# Patient Record
Sex: Female | Born: 1945 | Race: White | Hispanic: No | Marital: Married | Smoking: Former smoker
Health system: Southern US, Community
[De-identification: ages and names within clinical notes are randomized; demographics above are authoritative.]

## PROBLEM LIST (undated history)

## (undated) DIAGNOSIS — N200 Calculus of kidney: Secondary | ICD-10-CM

## (undated) DIAGNOSIS — K449 Diaphragmatic hernia without obstruction or gangrene: Secondary | ICD-10-CM

## (undated) DIAGNOSIS — I10 Essential (primary) hypertension: Secondary | ICD-10-CM

## (undated) DIAGNOSIS — K2270 Barrett's esophagus without dysplasia: Secondary | ICD-10-CM

## (undated) DIAGNOSIS — F32A Depression, unspecified: Secondary | ICD-10-CM

## (undated) DIAGNOSIS — F329 Major depressive disorder, single episode, unspecified: Secondary | ICD-10-CM

## (undated) DIAGNOSIS — I4891 Unspecified atrial fibrillation: Secondary | ICD-10-CM

## (undated) HISTORY — PX: CHOLECYSTECTOMY: SHX55

---

## 2009-09-17 ENCOUNTER — Ambulatory Visit: Payer: Self-pay | Admitting: Diagnostic Radiology

## 2009-09-17 ENCOUNTER — Emergency Department (HOSPITAL_BASED_OUTPATIENT_CLINIC_OR_DEPARTMENT_OTHER): Admission: EM | Admit: 2009-09-17 | Discharge: 2009-09-17 | Payer: Self-pay | Admitting: Emergency Medicine

## 2009-09-19 ENCOUNTER — Emergency Department (HOSPITAL_BASED_OUTPATIENT_CLINIC_OR_DEPARTMENT_OTHER): Admission: EM | Admit: 2009-09-19 | Discharge: 2009-09-19 | Payer: Self-pay | Admitting: Emergency Medicine

## 2009-09-19 ENCOUNTER — Ambulatory Visit: Payer: Self-pay | Admitting: Diagnostic Radiology

## 2010-08-17 LAB — BASIC METABOLIC PANEL
CO2: 20 mEq/L (ref 19–32)
Calcium: 8.8 mg/dL (ref 8.4–10.5)
Chloride: 104 mEq/L (ref 96–112)
GFR calc non Af Amer: 56 mL/min — ABNORMAL LOW (ref >60)
Potassium: 3 mEq/L — ABNORMAL LOW (ref 3.5–5.1)

## 2010-08-17 LAB — URINALYSIS, ROUTINE W REFLEX MICROSCOPIC
Glucose, UA: NEGATIVE mg/dL
Ketones, ur: NEGATIVE mg/dL
Nitrite: NEGATIVE
Nitrite: NEGATIVE
Protein, ur: 30 mg/dL — AB
Protein, ur: NEGATIVE mg/dL
Specific Gravity, Urine: 1.008 (ref 1.005–1.030)
Urobilinogen, UA: 0.2 mg/dL (ref 0.0–1.0)

## 2010-08-17 LAB — DIFFERENTIAL
Basophils Relative: 1 % (ref 0–1)
Eosinophils Absolute: 0 10*3/uL (ref 0.0–0.7)
Eosinophils Absolute: 0.1 10*3/uL (ref 0.0–0.7)
Eosinophils Relative: 1 % (ref 0–5)
Lymphs Abs: 0.8 10*3/uL (ref 0.7–4.0)
Monocytes Absolute: 0.9 10*3/uL (ref 0.1–1.0)
Monocytes Absolute: 0.9 10*3/uL (ref 0.1–1.0)
Monocytes Relative: 11 % (ref 3–12)
Monocytes Relative: 11 % (ref 3–12)
Neutro Abs: 6.6 10*3/uL (ref 1.7–7.7)

## 2010-08-17 LAB — COMPREHENSIVE METABOLIC PANEL
Albumin: 3.5 g/dL (ref 3.5–5.2)
CO2: 22 mEq/L (ref 19–32)
Calcium: 8.8 mg/dL (ref 8.4–10.5)
Creatinine, Ser: 0.9 mg/dL (ref 0.4–1.2)
GFR calc non Af Amer: >60 mL/min (ref >60)
Glucose, Bld: 116 mg/dL — ABNORMAL HIGH (ref 70–99)
Total Bilirubin: 0.6 mg/dL (ref 0.3–1.2)

## 2010-08-17 LAB — CBC
HCT: 34.4 % — ABNORMAL LOW (ref 36.0–46.0)
Hemoglobin: 11.8 g/dL — ABNORMAL LOW (ref 12.0–15.0)
MCV: 84.1 fL (ref 78.0–100.0)
Platelets: 364 10*3/uL (ref 150–400)
RDW: 12.5 % (ref 11.5–15.5)

## 2010-08-17 LAB — URINE MICROSCOPIC-ADD ON

## 2010-08-17 LAB — LIPASE, BLOOD: Lipase: 28 U/L (ref 23–300)

## 2010-08-17 LAB — RAPID STREP SCREEN (MED CTR MEBANE ONLY): Streptococcus, Group A Screen (Direct): NEGATIVE

## 2011-11-02 ENCOUNTER — Emergency Department (HOSPITAL_BASED_OUTPATIENT_CLINIC_OR_DEPARTMENT_OTHER)
Admission: EM | Admit: 2011-11-02 | Discharge: 2011-11-02 | Disposition: A | Discharge status: Home / Self Care | Payer: Self-pay | Attending: Emergency Medicine | Admitting: Emergency Medicine

## 2011-11-02 ENCOUNTER — Emergency Department (HOSPITAL_BASED_OUTPATIENT_CLINIC_OR_DEPARTMENT_OTHER): Payer: Self-pay

## 2011-11-02 ENCOUNTER — Encounter (HOSPITAL_BASED_OUTPATIENT_CLINIC_OR_DEPARTMENT_OTHER): Payer: Self-pay | Admitting: Emergency Medicine

## 2011-11-02 DIAGNOSIS — Z0389 Encounter for observation for other suspected diseases and conditions ruled out: Secondary | ICD-10-CM | POA: Insufficient documentation

## 2011-11-02 DIAGNOSIS — I10 Essential (primary) hypertension: Secondary | ICD-10-CM | POA: Insufficient documentation

## 2011-11-02 DIAGNOSIS — Z0000 Encounter for general adult medical examination without abnormal findings: Secondary | ICD-10-CM

## 2011-11-02 HISTORY — DX: Barrett's esophagus without dysplasia: K22.70

## 2011-11-02 HISTORY — DX: Major depressive disorder, single episode, unspecified: F32.9

## 2011-11-02 HISTORY — DX: Essential (primary) hypertension: I10

## 2011-11-02 HISTORY — DX: Depression, unspecified: F32.A

## 2011-11-02 NOTE — ED Notes (Signed)
Pt swallowed a straight pin about 30 min ago.  Pt denies any resp difficulty.  Does not have any sensation of it at this time.

## 2011-11-02 NOTE — Discharge Instructions (Signed)
Continue to monitor yourself. If you continue to feel fine without any pain then it's important to followup with your primary care provider and your GI specialist as needed for your routine physical exams and colonoscopy. However if you have acute onset pain at any time then return to emergency department for reevaluation.  Normal Exam, Adult You were seen and examined today in our facility. Our caregiver found nothing wrong on the exam. If testing was done such as lab work or x-rays, they did not indicate enough wrong to suggest that treatment should be given. The caregiver then must decide after testing is finished if your concern is a physical problem or illness that needs treatment. Today no treatable problem was found. Even if reassurance was given, if you feel you are getting worse when you get home make sure you call back or return to our department. For the protection of your privacy, test results can not be given over the phone. Make sure you receive the results of your test. Ask as to how these results are to be obtained if you have not been informed. It is your responsibility to obtain your test results. Your condition can change over time. Sometimes it takes more than one visit to determine the cause of your symptoms. It is important that you monitor your condition for any changes. SEEK IMMEDIATE MEDICAL CARE IF:  You develop an oral temperature above 102 F (38.9 C), which lasts for more than 2 days, not controlled by medications. Only take over-the-counter or prescription medicines for pain, discomfort, or fever as directed by your caregiver.   You develop a loss of appetite or start throwing up (vomiting).   You develop a rash, cough, belly (abdominal) pain, earache, headache, or develop pain in neck, muscles, or joints.   The problem or problems which brought you to our facility become worse or are a cause of more concern.  If we have told you today your exam and tests are normal, and  a short while later you feel this is not right, please seek medical attention so you may be rechecked. Document Released: 08/28/2000 Document Revised: 05/05/2011 Document Reviewed: 12/21/2007 [facility] Patient Information 2012 [place], [place].

## 2011-11-02 NOTE — ED Notes (Signed)
Patient transported to X-ray 

## 2011-11-02 NOTE — ED Provider Notes (Signed)
History     CSN: [id number]  Arrival date & time 11/02/11  Ernestina Columbia   First MD Initiated Contact with Patient 11/02/11 1952      Chief Complaint  Patient presents with  . Foreign Body    (Consider location/radiation/quality/duration/timing/severity/associated sxs/prior treatment) HPI  Patient presents to emergency department concerned that there is a chance that she could have swallowed a straight pin about 30 minutes prior to arrival. Patient states she was sewing and had placed 2 pins in her mouth to hold in all of the sudden became aware that she only had one pin left in her mouth. Patient states she panicked and pulled the pin from her mouth and is unsure whether or not she swallowed a second opinion. However patient states she did not actually feel herself swallow the pin, did not have any discomfort, and currently has no discomfort. Patient states she looked around her and could not find a second pin on the ground. Once again she denies any pain in her throat, neck, chest, or abdomen. She has been pain-free the whole time. Patient states she does have a primary care physician and last had colonoscopy and endoscopy about 10 years ago because she has barrett esophagus and is due for a colonoscopy in the near future.  Past Medical History  Diagnosis Date  . Hypertension   . Barrett esophagus   . Depression     History reviewed. No pertinent past surgical history.  No family history on file.  History  Substance Use Topics  . Smoking status: Former Games developer  . Smokeless tobacco: Never Used  . Alcohol Use: Yes     occ    OB History    Grav Para Term Preterm Abortions TAB SAB Ect Mult Living                  Review of Systems  All other systems reviewed and are negative.    Allergies  Review of patient's allergies indicates no known allergies.  Home Medications   Current Outpatient Rx  Name Route Sig Dispense Refill  . AMLODIPINE BESYLATE 10 MG PO TABS Oral Take 10 mg  by mouth daily.    Marland Kitchen ESCITALOPRAM OXALATE 10 MG PO TABS Oral Take 10 mg by mouth daily.    . IBUPROFEN 200 MG PO TABS Oral Take 400 mg by mouth every 6 (six) hours as needed. Patient used this medication for headache.    Marland Kitchen PANTOPRAZOLE SODIUM 40 MG PO TBEC Oral Take 40 mg by mouth daily. Patient is using this medication for Barrett's disease.      BP 152/92  Pulse 94  Temp(Src) 98.3 F (36.8 C) (Oral)  Resp 20  Ht 5\' 3"  (1.6 m)  Wt 160 lb (72.576 kg)  BMI 28.34 kg/m2  SpO2 99%  Physical Exam  Nursing note and vitals reviewed. Constitutional: She is oriented to person, place, and time. She appears well-developed and well-nourished. No distress.  HENT:  Head: Normocephalic and atraumatic.  Mouth/Throat: Oropharynx is clear and moist.  Eyes: Conjunctivae are normal.  Neck: Normal range of motion. Neck supple. No tracheal deviation present.  Cardiovascular: Normal rate, regular rhythm, normal heart sounds and intact distal pulses.  Exam reveals no gallop and no friction rub.   No murmur heard. Pulmonary/Chest: Effort normal and breath sounds normal. No stridor. No respiratory distress. She has no wheezes. She has no rales. She exhibits no tenderness.  Abdominal: Soft. Bowel sounds are normal. She exhibits no distension and  no mass. There is no tenderness. There is no rebound and no guarding.  Musculoskeletal: She exhibits no tenderness.  Neurological: She is alert and oriented to person, place, and time.  Skin: No rash noted. She is not diaphoretic.  Psychiatric: She has a normal mood and affect.    ED Course  Procedures (including critical care time)  Labs Reviewed - No data to display Dg Abd Acute W/chest  11/02/2011  *RADIOLOGY REPORT*  Clinical Data: Possible aspiration of a foreign body.  ACUTE ABDOMEN SERIES (ABDOMEN 2 VIEW & CHEST 1 VIEW)  Comparison: Chest x-ray 09/17/2009.  Findings: Lung volumes are normal.  No consolidative airspace disease.  No pleural effusions.  No  pneumothorax.  No pulmonary nodule or mass noted.  Pulmonary vasculature and the cardiomediastinal silhouette are within normal limits.  No radiopaque foreign body identified.  No pneumoperitoneum.  Supine and upright views of the abdomen demonstrate no unexpected radiopaque foreign body.  Gas and stool are seen throughout the colon.  There is a paucity of distal colonic gas noted.  However, there is no pathologic distension of small bowel.  IMPRESSION: 1.  No radiopaque foreign body identified. 2.  Nonspecific, nonobstructive bowel gas pattern. 3.  No pneumoperitoneum. 4.  No radiographic evidence of acute cardiopulmonary disease.  Original Report Authenticated By: Florencia Reasons, M.D.     1. Normal physical exam       MDM  There is no certainty that patient actually swallowed a straight pin with no radiographic evidence of foreign body in given that the straight pin is metal there should be a highly likelihood that foreign body would show up on radiograph present. Patient has no pain throughout the events. At this time there is no need for further evaluation unless patient becomes symptomatic. I spoke at length with patient about following up with her primary care physician and her gastroenterologist in the near future for her colonoscopy that she is due for however returning to emergency department at any time for any changing or worsening of symptoms. Patient voices her understanding and is agreeable plan.        [place], [place] 11/02/11 2148

## 2011-11-02 NOTE — ED Provider Notes (Signed)
Medical screening examination/treatment/procedure(s) were performed by non-physician practitioner and as supervising physician I was immediately available for consultation/collaboration.   Mayur Duman A Dontavious Emily, MD 11/02/11 2238 

## 2011-11-02 NOTE — ED Notes (Signed)
Pt states that she swallowed a straight pin approx 45 mins ago. Pt denies any bleeding, no diff breathing, no discomfort at this time.

## 2013-05-22 ENCOUNTER — Encounter (HOSPITAL_BASED_OUTPATIENT_CLINIC_OR_DEPARTMENT_OTHER): Payer: Self-pay | Admitting: Emergency Medicine

## 2013-05-22 ENCOUNTER — Emergency Department (HOSPITAL_BASED_OUTPATIENT_CLINIC_OR_DEPARTMENT_OTHER)
Admission: EM | Admit: 2013-05-22 | Discharge: 2013-05-23 | Disposition: A | Discharge status: Other Acute Inpatient Hospital | Payer: Medicare Other | Attending: Emergency Medicine | Admitting: Emergency Medicine

## 2013-05-22 DIAGNOSIS — R110 Nausea: Secondary | ICD-10-CM | POA: Insufficient documentation

## 2013-05-22 DIAGNOSIS — E876 Hypokalemia: Secondary | ICD-10-CM | POA: Insufficient documentation

## 2013-05-22 DIAGNOSIS — Z87891 Personal history of nicotine dependence: Secondary | ICD-10-CM | POA: Insufficient documentation

## 2013-05-22 DIAGNOSIS — F329 Major depressive disorder, single episode, unspecified: Secondary | ICD-10-CM | POA: Insufficient documentation

## 2013-05-22 DIAGNOSIS — R209 Unspecified disturbances of skin sensation: Secondary | ICD-10-CM | POA: Insufficient documentation

## 2013-05-22 DIAGNOSIS — R079 Chest pain, unspecified: Secondary | ICD-10-CM

## 2013-05-22 DIAGNOSIS — R072 Precordial pain: Secondary | ICD-10-CM | POA: Insufficient documentation

## 2013-05-22 DIAGNOSIS — Z8719 Personal history of other diseases of the digestive system: Secondary | ICD-10-CM | POA: Insufficient documentation

## 2013-05-22 DIAGNOSIS — F3289 Other specified depressive episodes: Secondary | ICD-10-CM | POA: Insufficient documentation

## 2013-05-22 DIAGNOSIS — Z79899 Other long term (current) drug therapy: Secondary | ICD-10-CM | POA: Insufficient documentation

## 2013-05-22 DIAGNOSIS — I10 Essential (primary) hypertension: Secondary | ICD-10-CM | POA: Insufficient documentation

## 2013-05-22 DIAGNOSIS — R000 Tachycardia, unspecified: Secondary | ICD-10-CM | POA: Insufficient documentation

## 2013-05-22 MED ORDER — ASPIRIN 81 MG PO CHEW
CHEWABLE_TABLET | ORAL | Status: AC
  Filled 2013-05-22: qty 2

## 2013-05-22 NOTE — ED Notes (Signed)
MD at bedside. 

## 2013-05-22 NOTE — ED Notes (Signed)
Patient oxygen saturation is 100% on R/a not 10%

## 2013-05-22 NOTE — ED Provider Notes (Signed)
CSN: [id number]     Arrival date & time 05/22/13  2337 History   First MD Initiated Contact with Patient 05/22/13 2352     This chart was scribed for Misty Seamen, MD by Ladona Ridgel Day, ED scribe. This patient was seen in room MH09/MH09 and the patient's care was started at 2352.  Chief Complaint  Patient presents with  . Chest Pain   The history is provided by the patient. No language interpreter was used.   HPI Comments: Misty Santos is a 67 y.o. female who presents to the Emergency Department complaining of non-radiating, 4/10 midsternal CP, onset x1 hour ago PTA. She reports paraesthesias to her hands and feet. She took x2 baby ASA PTA. She reports mild nausea but denies diaphoresis. She denies any previous similar episodes. She denies hx of esophageal spasms. She has baseline ankle swelling. She denies flu like sx, denies emesis episodes or diarrhea.   Past Medical History  Diagnosis Date  . Hypertension   . Barrett esophagus   . Depression    History reviewed. No pertinent past surgical history. No family history on file. History  Substance Use Topics  . Smoking status: Former Games developer  . Smokeless tobacco: Never Used  . Alcohol Use: Yes     Comment: occ   OB History   Grav Para Term Preterm Abortions TAB SAB Ect Mult Living                 Review of Systems A complete 10 system review of systems was obtained and all systems are negative except as noted in the HPI and PMH.   Allergies  Review of patient's allergies indicates no known allergies.  Home Medications   Current Outpatient Rx  Name  Route  Sig  Dispense  Refill  . atorvastatin (LIPITOR) 20 MG tablet   Oral   Take 20 mg by mouth daily.         . potassium chloride SA (K-DUR,KLOR-CON) 20 MEQ tablet   Oral   Take 20 mEq by mouth 2 (two) times daily.         Marland Kitchen amLODipine (NORVASC) 10 MG tablet   Oral   Take 10 mg by mouth daily.         Marland Kitchen escitalopram (LEXAPRO) 10 MG tablet   Oral   Take 10 mg  by mouth daily.         Marland Kitchen ibuprofen (ADVIL,MOTRIN) 200 MG tablet   Oral   Take 400 mg by mouth every 6 (six) hours as needed. Patient used this medication for headache.         . pantoprazole (PROTONIX) 40 MG tablet   Oral   Take 40 mg by mouth daily. Patient is using this medication for Barrett's disease.          Triage Vitals: BP 169/96  Pulse 118  Temp(Src) 97.6 F (36.4 C) (Oral)  Resp 20  Ht 5\' 3"  (1.6 m)  Wt 165 lb (74.844 kg)  BMI 29.24 kg/m2  SpO2 100% Physical Exam  Nursing note and vitals reviewed. Constitutional: She is oriented to person, place, and time. She appears well-developed and well-nourished.  HENT:  Head: Normocephalic and atraumatic.  Eyes: Conjunctivae are normal. Right eye exhibits no discharge. Left eye exhibits no discharge.  Neck: Normal range of motion.  Cardiovascular: Regular rhythm and normal heart sounds.   No murmur heard. tachycardic  Pulmonary/Chest: Effort normal. No respiratory distress.  Musculoskeletal: Normal range of motion. She exhibits  no edema.  Neurological: She is alert and oriented to person, place, and time.  Skin: Skin is warm and dry.  Psychiatric: She has a normal mood and affect. Thought content normal.    ED Course  Procedures (including critical care time) DIAGNOSTIC STUDIES: Oxygen Saturation is 100% on room air, normal by my interpretation.    COORDINATION OF CARE: At 1200 AM Discussed treatment plan with patient which includes ASA, cardiac enzymes, CXR, blood work, NTG, D-dimer, EKG. Patient agrees.   EKG Interpretation    Date/Time:  Wednesday May 22 2013 23:49:07 EST Ventricular Rate:  109 PR Interval:  170 QRS Duration: 88 QT Interval:  348 QTC Calculation: 468 R Axis:   -38 Text Interpretation:  Sinus tachycardia Left axis deviation Nonspecific ST and T wave abnormality Abnormal ECG No old tracing to compare Confirmed by Anarie Kalish  MD, Merl Guardino ([id number]) on 05/22/2013 11:52:47 PM       MDM    Nursing notes and vitals signs, including pulse oximetry, reviewed.  Summary of this visit's results, reviewed by myself:  Labs:  Results for orders placed during the hospital encounter of 05/22/13 (from the past 24 hour(s))  CBC WITH DIFFERENTIAL     Status: None   Collection Time    05/22/13 11:58 PM      Result Value Range   WBC 9.3  4.0 - 10.5 K/uL   RBC 4.72  3.87 - 5.11 MIL/uL   Hemoglobin 13.7  12.0 - 15.0 g/dL   HCT 16.1  09.6 - 04.5 %   MCV 82.6  78.0 - 100.0 fL   MCH 29.0  26.0 - 34.0 pg   MCHC 35.1  30.0 - 36.0 g/dL   RDW 40.9  81.1 - 91.4 %   Platelets 393  150 - 400 K/uL   Neutrophils Relative % 48  43 - 77 %   Neutro Abs 4.5  1.7 - 7.7 K/uL   Lymphocytes Relative 41  12 - 46 %   Lymphs Abs 3.8  0.7 - 4.0 K/uL   Monocytes Relative 9  3 - 12 %   Monocytes Absolute 0.8  0.1 - 1.0 K/uL   Eosinophils Relative 3  0 - 5 %   Eosinophils Absolute 0.2  0.0 - 0.7 K/uL   Basophils Relative 0  0 - 1 %   Basophils Absolute 0.0  0.0 - 0.1 K/uL  BASIC METABOLIC PANEL     Status: Abnormal   Collection Time    05/22/13 11:58 PM      Result Value Range   Sodium 136  135 - 145 mEq/L   Potassium 2.7 (*) 3.5 - 5.1 mEq/L   Chloride 100  96 - 112 mEq/L   CO2 19  19 - 32 mEq/L   Glucose, Bld 109 (*) 70 - 99 mg/dL   BUN 18  6 - 23 mg/dL   Creatinine, Ser 7.82  0.50 - 1.10 mg/dL   Calcium 9.7  8.4 - 95.6 mg/dL   GFR calc non Af Amer 58 (*) >90 mL/min   GFR calc Af Amer 67 (*) >90 mL/min  TROPONIN I     Status: None   Collection Time    05/22/13 11:58 PM      Result Value Range   Troponin I <0.30  <0.30 ng/mL  D-DIMER, QUANTITATIVE     Status: None   Collection Time    05/22/13 11:58 PM      Result Value Range   D-Dimer, Quant <0.27  0.00 - 0.48 ug/mL-FEU    Imaging Studies: Dg Chest Portable 1 View  05/23/2013   CLINICAL DATA:  Midsternal chest pain and bilateral hand and foot paresthesias. Mild nausea.  EXAM: PORTABLE CHEST - 1 VIEW  COMPARISON:  Chest radiograph  performed 09/17/2009  FINDINGS: The lungs are well-aerated and clear. There is no evidence of focal opacification, pleural effusion or pneumothorax.  The cardiomediastinal silhouette is within normal limits. No acute osseous abnormalities are seen.  IMPRESSION: No acute cardiopulmonary process seen.   Electronically Signed   By: Roanna Raider M.D.   On: 05/23/2013 00:37   1:08 AM Patient's discomfort significantly improved after sublingual nitroglycerin; some discomfort remains. We are replacing her potassium. We will have her admitted.   I personally performed the services described in this documentation, which was scribed in my presence.  The recorded information has been reviewed and considered.      Misty Seamen, MD 05/23/13 [phone]

## 2013-05-22 NOTE — ED Notes (Signed)
Patient with c/o intermittent chest pain located at her mid chest area that happened about an hour ago. Pain is non-radiating.  Patient states that when she feels the pain, her whole chest feels numb and also her legs.  Patient did experience slight nausea, dizziness, slight shortness of breath.  Patient is alert and oriented x 3

## 2013-05-23 ENCOUNTER — Emergency Department (HOSPITAL_BASED_OUTPATIENT_CLINIC_OR_DEPARTMENT_OTHER): Payer: Medicare Other

## 2013-05-23 LAB — CBC WITH DIFFERENTIAL/PLATELET
Lymphocytes Relative: 41 % (ref 12–46)
Lymphs Abs: 3.8 10*3/uL (ref 0.7–4.0)
Monocytes Relative: 9 % (ref 3–12)
Neutro Abs: 4.5 10*3/uL (ref 1.7–7.7)
Neutrophils Relative %: 48 % (ref 43–77)
Platelets: 393 10*3/uL (ref 150–400)
RBC: 4.72 MIL/uL (ref 3.87–5.11)
WBC: 9.3 10*3/uL (ref 4.0–10.5)

## 2013-05-23 LAB — BASIC METABOLIC PANEL
BUN: 18 mg/dL (ref 6–23)
Calcium: 9.7 mg/dL (ref 8.4–10.5)
Chloride: 100 mEq/L (ref 96–112)
GFR calc non Af Amer: 58 mL/min — ABNORMAL LOW (ref >90)
Glucose, Bld: 109 mg/dL — ABNORMAL HIGH (ref 70–99)

## 2013-05-23 MED ORDER — SODIUM CHLORIDE 0.9 % IV SOLN
INTRAVENOUS | Status: DC
  Administered 2013-05-23: 1000 mL via INTRAVENOUS

## 2013-05-23 MED ORDER — POTASSIUM CHLORIDE CRYS ER 20 MEQ PO TBCR
40.0000 meq | EXTENDED_RELEASE_TABLET | Freq: Once | ORAL | Status: AC
  Administered 2013-05-23: 40 meq via ORAL
  Filled 2013-05-23: qty 2

## 2013-05-23 MED ORDER — NITROGLYCERIN 0.4 MG SL SUBL
0.4000 mg | SUBLINGUAL_TABLET | SUBLINGUAL | Status: DC | PRN
  Administered 2013-05-23 (×3): 0.4 mg via SUBLINGUAL
  Filled 2013-05-23: qty 25

## 2013-05-23 MED ORDER — ASPIRIN 81 MG PO CHEW
162.0000 mg | CHEWABLE_TABLET | Freq: Once | ORAL | Status: AC
  Administered 2013-05-23: 162 mg via ORAL

## 2013-05-23 MED ORDER — NITROGLYCERIN 0.4 MG SL SUBL
SUBLINGUAL_TABLET | SUBLINGUAL | Status: AC
  Administered 2013-05-23: 0.4 mg via SUBLINGUAL
  Filled 2013-05-23: qty 25

## 2013-05-23 MED ORDER — POTASSIUM CHLORIDE 10 MEQ/100ML IV SOLN
10.0000 meq | Freq: Once | INTRAVENOUS | Status: AC
  Administered 2013-05-23: 10 meq via INTRAVENOUS
  Filled 2013-05-23: qty 100

## 2013-05-23 NOTE — ED Notes (Signed)
Portable cxr completed.

## 2013-05-23 NOTE — ED Notes (Signed)
Placed on cardiac, pulse ox and bp monitoring. 

## 2015-02-16 ENCOUNTER — Encounter (HOSPITAL_BASED_OUTPATIENT_CLINIC_OR_DEPARTMENT_OTHER): Payer: Self-pay | Admitting: *Deleted

## 2015-02-16 ENCOUNTER — Emergency Department (HOSPITAL_BASED_OUTPATIENT_CLINIC_OR_DEPARTMENT_OTHER): Payer: Medicare Other

## 2015-02-16 ENCOUNTER — Emergency Department (HOSPITAL_BASED_OUTPATIENT_CLINIC_OR_DEPARTMENT_OTHER)
Admission: EM | Admit: 2015-02-16 | Discharge: 2015-02-17 | Disposition: A | Discharge status: Home / Self Care | Payer: Medicare Other | Attending: Emergency Medicine | Admitting: Emergency Medicine

## 2015-02-16 DIAGNOSIS — R110 Nausea: Secondary | ICD-10-CM | POA: Diagnosis not present

## 2015-02-16 DIAGNOSIS — Z87442 Personal history of urinary calculi: Secondary | ICD-10-CM | POA: Diagnosis not present

## 2015-02-16 DIAGNOSIS — Z79899 Other long term (current) drug therapy: Secondary | ICD-10-CM | POA: Insufficient documentation

## 2015-02-16 DIAGNOSIS — R109 Unspecified abdominal pain: Secondary | ICD-10-CM | POA: Diagnosis present

## 2015-02-16 DIAGNOSIS — K2270 Barrett's esophagus without dysplasia: Secondary | ICD-10-CM | POA: Diagnosis not present

## 2015-02-16 DIAGNOSIS — I10 Essential (primary) hypertension: Secondary | ICD-10-CM | POA: Diagnosis not present

## 2015-02-16 DIAGNOSIS — F329 Major depressive disorder, single episode, unspecified: Secondary | ICD-10-CM | POA: Insufficient documentation

## 2015-02-16 DIAGNOSIS — Z87891 Personal history of nicotine dependence: Secondary | ICD-10-CM | POA: Diagnosis not present

## 2015-02-16 HISTORY — DX: Diaphragmatic hernia without obstruction or gangrene: K44.9

## 2015-02-16 HISTORY — DX: Calculus of kidney: N20.0

## 2015-02-16 LAB — URINALYSIS, ROUTINE W REFLEX MICROSCOPIC
Glucose, UA: NEGATIVE mg/dL
Hgb urine dipstick: NEGATIVE
Ketones, ur: NEGATIVE mg/dL
NITRITE: NEGATIVE
Protein, ur: NEGATIVE mg/dL
Specific Gravity, Urine: 1.027 (ref 1.005–1.030)
Urobilinogen, UA: 0.2 mg/dL (ref 0.0–1.0)
pH: 6 (ref 5.0–8.0)

## 2015-02-16 LAB — URINE MICROSCOPIC-ADD ON

## 2015-02-16 LAB — CBC
HCT: 40.7 % (ref 36.0–46.0)
HEMOGLOBIN: 13.9 g/dL (ref 12.0–15.0)
MCH: 28.4 pg (ref 26.0–34.0)
MCHC: 34.2 g/dL (ref 30.0–36.0)
MCV: 83.2 fL (ref 78.0–100.0)
PLATELETS: 367 10*3/uL (ref 150–400)
RBC: 4.89 MIL/uL (ref 3.87–5.11)
RDW: 12.7 % (ref 11.5–15.5)
WBC: 9 10*3/uL (ref 4.0–10.5)

## 2015-02-16 LAB — COMPREHENSIVE METABOLIC PANEL
ALK PHOS: 114 U/L (ref 38–126)
ALT: 16 U/L (ref 14–54)
AST: 22 U/L (ref 15–41)
Albumin: 4 g/dL (ref 3.5–5.0)
Anion gap: 11 (ref 5–15)
BILIRUBIN TOTAL: 0.6 mg/dL (ref 0.3–1.2)
BUN: 14 mg/dL (ref 6–20)
CALCIUM: 9 mg/dL (ref 8.9–10.3)
CO2: 23 mmol/L (ref 22–32)
CREATININE: 0.94 mg/dL (ref 0.44–1.00)
Chloride: 102 mmol/L (ref 101–111)
GFR calc non Af Amer: >60 mL/min (ref >60)
Glucose, Bld: 110 mg/dL — ABNORMAL HIGH (ref 65–99)
Potassium: 3.9 mmol/L (ref 3.5–5.1)
Sodium: 136 mmol/L (ref 135–145)
TOTAL PROTEIN: 7.8 g/dL (ref 6.5–8.1)

## 2015-02-16 MED ORDER — ONDANSETRON HCL 4 MG/2ML IJ SOLN
4.0000 mg | Freq: Once | INTRAMUSCULAR | Status: AC
  Administered 2015-02-16: 4 mg via INTRAVENOUS
  Filled 2015-02-16: qty 2

## 2015-02-16 MED ORDER — KETOROLAC TROMETHAMINE 30 MG/ML IJ SOLN
30.0000 mg | Freq: Once | INTRAMUSCULAR | Status: AC
  Administered 2015-02-16: 30 mg via INTRAVENOUS
  Filled 2015-02-16: qty 1

## 2015-02-16 NOTE — ED Notes (Addendum)
Right flank pain radiating into right groin since 0800. Pt pale, nauseated

## 2015-02-16 NOTE — ED Notes (Signed)
Patient transported to CT 

## 2015-02-16 NOTE — ED Provider Notes (Signed)
CSN: [id number]     Arrival date & time 02/16/15  1943 History   First MD Initiated Contact with Patient 02/16/15 2106     Chief Complaint  Patient presents with  . Flank Pain     (Consider location/radiation/quality/duration/timing/severity/associated sxs/prior Treatment) Patient is a 69 y.o. female presenting with flank pain. The history is provided by the patient and medical records.  Flank Pain Associated symptoms include nausea.    69 year old female with history of hypertension, Barrett's esophagus, depression, hiatal hernia, kidney stones, presenting to the ED for right flank pain which began this morning at 0800.   Patient states pain is radiating to her right lower abdomen and into the groin. She denies any hematuria or dysuria. She has had nausea without vomiting. No fever or chills.  She does have hx of kidney stones in the past but states this feels slightly different.  She denies any recent falls/injuries, however she did plant flowers in the garden with her husband over the weekend.  Denies significant heavy lifting.  No numbness/weakness of lower extremities.  No bowel/bladder incontinence.  VSS.  Past Medical History  Diagnosis Date  . Hypertension   . Barrett esophagus   . Depression   . Kidney stone   . Hiatal hernia    History reviewed. No pertinent past surgical history. No family history on file. Social History  Substance Use Topics  . Smoking status: Former Games developer  . Smokeless tobacco: Never Used  . Alcohol Use: Yes     Comment: occ   OB History    No data available     Review of Systems  Gastrointestinal: Positive for nausea.  Genitourinary: Positive for flank pain.  All other systems reviewed and are negative.     Allergies  Review of patient's allergies indicates no known allergies.  Home Medications   Prior to Admission medications   Medication Sig Start Date End Date Taking? Authorizing Provider  amLODipine (NORVASC) 10 MG tablet Take 10  mg by mouth daily.   Yes Historical Provider, MD  atorvastatin (LIPITOR) 20 MG tablet Take 20 mg by mouth daily.   Yes Historical Provider, MD  escitalopram (LEXAPRO) 10 MG tablet Take 10 mg by mouth daily.   Yes Historical Provider, MD  ibuprofen (ADVIL,MOTRIN) 200 MG tablet Take 400 mg by mouth every 6 (six) hours as needed. Patient used this medication for headache.   Yes Historical Provider, MD  pantoprazole (PROTONIX) 40 MG tablet Take 40 mg by mouth daily. Patient is using this medication for Barrett's disease.   Yes Historical Provider, MD  potassium chloride SA (K-DUR,KLOR-CON) 20 MEQ tablet Take 20 mEq by mouth 2 (two) times daily.    Historical Provider, MD   BP 105/66 mmHg  Pulse 87  Temp(Src) 98.5 F (36.9 C) (Oral)  Resp 20  Ht  (1.6 m)  Wt 154 lb (69.854 kg)  BMI 27.29 kg/m2  SpO2 100%   Physical Exam  Constitutional: She is oriented to person, place, and time. She appears well-developed and well-nourished. No distress.  HENT:  Head: Normocephalic and atraumatic.  Mouth/Throat: Oropharynx is clear and moist.  Eyes: Conjunctivae and EOM are normal. Pupils are equal, round, and reactive to light.  Neck: Normal range of motion. Neck supple.  Cardiovascular: Normal rate, regular rhythm and normal heart sounds.   Pulmonary/Chest: Effort normal and breath sounds normal. No respiratory distress. She has no wheezes.  Abdominal: Soft. Bowel sounds are normal. There is no tenderness. There is CVA  tenderness (right). There is no guarding.  Musculoskeletal: Normal range of motion.       Thoracic back: She exhibits tenderness and pain. She exhibits no bony tenderness.  TTP of right thoracic paraspinal region; no midline tenderness, deformities, or step-off; full ROM maintained; extremities are all NVI with normal strength  Neurological: She is alert and oriented to person, place, and time.  Skin: Skin is warm and dry. She is not diaphoretic.  Psychiatric: She has a normal mood  and affect.  Nursing note and vitals reviewed.   ED Course  Procedures (including critical care time) Labs Review Labs Reviewed  URINALYSIS, ROUTINE W REFLEX MICROSCOPIC (NOT AT [facility]) - Abnormal; Notable for the following:    Bilirubin Urine SMALL (*)    Leukocytes, UA SMALL (*)    All other components within normal limits  COMPREHENSIVE METABOLIC PANEL - Abnormal; Notable for the following:    Glucose, Bld 110 (*)    All other components within normal limits  CBC  URINE MICROSCOPIC-ADD ON    Imaging Review Ct Renal Stone Study  02/16/2015   CLINICAL DATA:  Right flank pain radiating to groin since 8 a.m. History of hypertension, kidney stones, hiatal hernia.  EXAM: CT ABDOMEN AND PELVIS WITHOUT CONTRAST  TECHNIQUE: Multidetector CT imaging of the abdomen and pelvis was performed following the standard protocol without IV contrast.  COMPARISON:  09/19/2009  FINDINGS: Lower chest: No pulmonary nodules, pleural effusions, or infiltrates. Heart size is normal. No imaged pericardial effusion or significant coronary artery calcifications.  Upper abdomen: The kidneys are normal in appearance. No intrarenal or ureteral stones.  No focal abnormality identified within the liver, pancreas, adrenal glands. The spleen has a normal appearance. 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 Gallbladder is present.  Gastrointestinal tract: The stomach and small bowel loops are normal in appearance. The appendix is not well seen. Colonic loops are normal in appearance.  Pelvis: The uterus is present. No adnexal mass. No free pelvic fluid.  Retroperitoneum: There is mild atherosclerotic calcification of the abdominal aorta. No aneurysm. No retroperitoneal or mesenteric adenopathy.  Abdominal wall: Unremarkable.  Osseous structures: Unremarkable.  IMPRESSION: 1. No renal or ureteral stones. 2. No bowel obstruction or abscess.   Electronically Signed   By: Norva Pavlov M.D.   On: 02/16/2015  22:39   I have personally reviewed and evaluated these images and lab results as part of my medical decision-making.   EKG Interpretation None      MDM   Final diagnoses:  Right flank pain  Nausea    69 year old female here with right flank pain. Patient is afebrile, nontoxic. She does have right CVA tenderness as well as tenderness of her right thoracic paraspinal region. There is no acute midline deformity. No focal neurologic deficits to suggest cauda equina or other spinal cord injury. Labwork is reassuring.   UA is noninfectious, no hematuria. CT renal study was obtained US her symptoms are slightly concerning for renal stone, no acute calculi  Noted. Patient was treated in the emergency department with Toradol and Zofran with improvement of her pain. She is resting comfortably. Her symptoms may be somewhat muscular skeletal in nature given her gardening over the weekend. I've instructed her to avoid heavy lifting for the next few days to avoid further back strain. She will follow-up with her PCP. Rx Vicodin and Zofran.  Discussed plan with patient, he/she acknowledged understanding and agreed with plan of care.  Return precautions given for new or worsening symptoms.   Garlon Hatchet, PA-C 02/17/15 0045  Rolan Bucco, MD 02/26/15 1714

## 2015-02-17 MED ORDER — ONDANSETRON 4 MG PO TBDP: 4.0000 mg | ORAL_TABLET | Freq: Three times a day (TID) | ORAL | Status: AC | PRN

## 2015-02-17 MED ORDER — HYDROCODONE-ACETAMINOPHEN 5-325 MG PO TABS: 1.0000 | ORAL_TABLET | ORAL | Status: DC | PRN

## 2015-02-17 NOTE — Discharge Instructions (Signed)
Take the prescribed medication as directed.  Recommend to rest back-- avoid heavy lifting/pushing/pulling which may worsen back pain. Follow-up with your primary care physician. Return to the ED for new or worsening symptoms.

## 2017-09-26 ENCOUNTER — Other Ambulatory Visit (HOSPITAL_COMMUNITY)
Admission: RE | Admit: 2017-09-26 | Discharge: 2017-09-26 | Disposition: A | Discharge status: Home / Self Care | Payer: Medicare HMO | Source: Ambulatory Visit | Attending: Advanced Practice Midwife | Admitting: Advanced Practice Midwife

## 2017-09-26 ENCOUNTER — Ambulatory Visit (INDEPENDENT_AMBULATORY_CARE_PROVIDER_SITE_OTHER): Payer: Medicare HMO | Admitting: Advanced Practice Midwife

## 2017-09-26 ENCOUNTER — Encounter: Payer: Self-pay | Admitting: Advanced Practice Midwife

## 2017-09-26 VITALS — BP 158/88 | HR 75 | Ht 63.0 in | Wt 154.0 lb

## 2017-09-26 DIAGNOSIS — N898 Other specified noninflammatory disorders of vagina: Secondary | ICD-10-CM

## 2017-09-26 MED ORDER — FLUCONAZOLE 100 MG PO TABS: 100.0000 mg | ORAL_TABLET | ORAL | 1 refills | Status: DC

## 2017-09-26 MED ORDER — FLUCONAZOLE 100 MG PO TABS: 100.0000 mg | ORAL_TABLET | Freq: Every day | ORAL | 1 refills | Status: AC

## 2017-09-26 NOTE — Patient Instructions (Signed)

## 2017-09-26 NOTE — Progress Notes (Signed)
Patient complaining of itching and burning.  Patient reported using Monistat for three days and 7 days and the symptoms disappeared but then have come back. Armandina Stammer RN

## 2017-09-26 NOTE — Addendum Note (Signed)
Addended by: Anell Barr on: 09/26/2017 11:18 AM   Modules accepted: Orders

## 2017-09-26 NOTE — Progress Notes (Signed)
GYNECOLOGY CLINIC ANNUAL PREVENTATIVE CARE ENCOUNTER NOTE  Subjective:   Misty Santos is a 72 y.o. G3P3 female here for gynecologic exam.  Current complaints: severe itching and irritation of vulva and vagina.  Not much discharge with it.  Yeast creams worked temporarily but itching returned. .   Denies abnormal vaginal bleeding, discharge, pelvic pain, problems with intercourse or other gynecologic concerns.   Does not have time to do annual exam today.  Last pap over 10 years ago.  Recently lost husband a month ago, after 47 years of marriage.  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 Went through menopause in early 50s.  Not many vasomotor symptoms.    Gynecologic History No LMP recorded. Patient is postmenopausal.  Last Pap: 10 years ago. Results were: normal   Obstetric History OB History  Gravida Para Term Preterm AB Living  3            SAB TAB Ectopic Multiple Live Births          3    # Outcome Date GA Lbr Len/2nd Weight Sex Delivery Anes PTL Lv  3 Gravida      Vag-Spont     2 Gravida      Vag-Spont     1 Gravida      Vag-Spont       Past Medical History:  Diagnosis Date  . Barrett esophagus   . Depression   . Hiatal hernia   . Hypertension   . Kidney stone     History reviewed. No pertinent surgical history.  Current Outpatient Medications on File Prior to Visit  Medication Sig Dispense Refill  . amLODipine (NORVASC) 10 MG tablet Take 10 mg by mouth daily.    Marland Kitchen atorvastatin (LIPITOR) 20 MG tablet Take 20 mg by mouth daily.    . BELSOMRA 15 MG TABS TK 1 T PO ATN PRF SLP  2  . escitalopram (LEXAPRO) 10 MG tablet Take 10 mg by mouth daily.    Marland Kitchen ibuprofen (ADVIL,MOTRIN) 200 MG tablet Take 400 mg by mouth every 6 (six) hours as needed. Patient used this medication for headache.    . pantoprazole (PROTONIX) 40 MG tablet Take 40 mg by mouth daily. Patient is using this medication for Barrett's disease.    . potassium chloride SA (K-DUR,KLOR-CON) 20 MEQ tablet Take 20  mEq by mouth 2 (two) times daily.    Marland Kitchen donepezil (ARICEPT) 10 MG tablet 10 mg.   1  . ondansetron (ZOFRAN ODT) 4 MG disintegrating tablet Take 1 tablet (4 mg total) by mouth every 8 (eight) hours as needed for nausea. (Patient not taking: Reported on 09/26/2017) 10 tablet 0  . zolpidem (AMBIEN) 5 MG tablet Take by mouth.     No current facility-administered medications on file prior to visit.     No Known Allergies  Social History   Socioeconomic History  . Marital status: Married    Spouse name: Not on file  . Number of children: Not on file  . Years of education: Not on file  . Highest education level: Not on file  Occupational History  . Not on file  Social Needs  . Financial resource strain: Not on file  . Food insecurity:    Worry: Not on file    Inability: Not on file  . Transportation needs:    Medical: Not on file    Non-medical: Not on file  Tobacco Use  . Smoking status: Former Games developer  . Smokeless tobacco:  Never Used  Substance and Sexual Activity  . Alcohol use: Yes    Comment: occ  . Drug use: No  . Sexual activity: Not on file  Lifestyle  . Physical activity:    Days per week: Not on file    Minutes per session: Not on file  . Stress: Not on file  Relationships  . Social connections:    Talks on phone: Not on file    Gets together: Not on file    Attends religious service: Not on file    Active member of club or organization: Not on file    Attends meetings of clubs or organizations: Not on file    Relationship status: Not on file  . Intimate partner violence:    Fear of current or ex partner: Not on file    Emotionally abused: Not on file    Physically abused: Not on file    Forced sexual activity: Not on file  Other Topics Concern  . Not on file  Social History Narrative  . Not on file    Family History  Adopted: Yes    The following portions of the patient's history were reviewed and updated as appropriate: allergies, current medications,  past family history, past medical history, past social history, past surgical history and problem list.  Review of Systems Pertinent items are noted in HPI.   Objective:  BP (!) 158/88 Comment: patient has not taken bp meds today.  Pulse 75   Ht  (1.6 m)   Wt 154 lb (69.9 kg)   BMI 27.28 kg/m  CONSTITUTIONAL: Well-developed, well-nourished female in no acute distress.  HENT:  Normocephalic, atraumatic SKIN: Skin is warm and dry. No rash noted. Not diaphoretic. No erythema. No pallor. NEUROLGIC: Alert and oriented to person, place, and time. PSYCHIATRIC: Normal mood and affect. Normal behavior. Normal judgment and thought content. CARDIOVASCULAR: Normal heart rate noted, regular rhythm RESPIRATORY:  Effort  normal, no problems with respiration noted. ABDOMEN: Soft, normal bowel sounds, no distention noted.  No tenderness, rebound or guarding.  PELVIC: Normal appearing external genitalia; mildly atrophic vaginal mucosa.  No abnormal discharge noted.  There is erethema from periclitoral area to posterior vulva, mirror image bilaterally.  No open lesions.    MUSCULOSKELETAL: Normal range of motion.   Assessment:  Vulvar and vaginal erethema and pruritis Probable yeast vaginitis Mild atrophy Cannot rule out other dermatologic processes   Plan:  Will follow up results of wet prep and manage accordingly. Annual exam scheduled with Dr Erin Fulling Rx Diflucan for yeast vaginitis If erethema and pruritis do not resolve, will need reevaluation Please refer to After Visit Summary for other counseling recommendations.   Aviva Signs, CNM\ [facility]

## 2017-09-27 LAB — CERVICOVAGINAL ANCILLARY ONLY
Bacterial vaginitis: NEGATIVE
Candida vaginitis: NEGATIVE

## 2017-10-18 ENCOUNTER — Ambulatory Visit: Payer: Self-pay | Admitting: Obstetrics & Gynecology

## 2017-11-08 ENCOUNTER — Ambulatory Visit (INDEPENDENT_AMBULATORY_CARE_PROVIDER_SITE_OTHER): Payer: Medicare HMO | Admitting: Family Medicine

## 2017-11-08 ENCOUNTER — Encounter: Payer: Self-pay | Admitting: Family Medicine

## 2017-11-08 VITALS — BP 136/80 | HR 67 | Ht 63.0 in | Wt 154.8 lb

## 2017-11-08 DIAGNOSIS — Z01419 Encounter for gynecological examination (general) (routine) without abnormal findings: Secondary | ICD-10-CM

## 2017-11-08 DIAGNOSIS — L900 Lichen sclerosus et atrophicus: Secondary | ICD-10-CM

## 2017-11-08 DIAGNOSIS — Z01411 Encounter for gynecological examination (general) (routine) with abnormal findings: Secondary | ICD-10-CM

## 2017-11-08 MED ORDER — CLOBETASOL PROP EMOLLIENT BASE 0.05 % EX CREA: 1.0000 "application " | TOPICAL_CREAM | Freq: Two times a day (BID) | CUTANEOUS | 5 refills | Status: AC

## 2017-11-08 NOTE — Patient Instructions (Addendum)
Take your medicine for itching as follows: Twice daily x 6 wks, then daily x 6 wks, then every other day x 6 wks, then 2x/wk indefinitely Lichen Sclerosus Lichen sclerosus is a skin problem. It can happen on any part of the body. It happens most often in the anal or genital areas. It can cause itching and discomfort. Treatment can help to control symptoms. This skin problem is not passed from one person to another (not contagious). The cause is not known. Follow these instructions at home:  Take over-the-counter and prescription medicines only as told by your doctor.  Use creams or ointments as told by your doctor.  Do not scratch the affected areas of skin.  Women should keep the vagina as clean and dry as they can.  Keep all follow-up visits as told by your doctor. This is important. Contact a doctor if:  Your redness, swelling, or pain gets worse.  You have fluid, blood, or pus coming from the area.  You have new patches (lesions) on your skin.  You have a fever.  You have pain during sex. This information is not intended to replace advice given to you by your health care provider. Make sure you discuss any questions you have with your health care provider. Document Released: 04/28/2008 Document Revised: 10/22/2015 Document Reviewed: 08/11/2014 Elsevier Interactive Patient Education  [employer]2018 Elsevier Inc.

## 2017-11-08 NOTE — Progress Notes (Signed)
Subjective:     Misty KannerRita Santos is a 72 y.o. female and is here for a comprehensive physical exam. The patient reports problems - vaginal itching.  Social History   Socioeconomic History  . Marital status: Married    Spouse name: Not on file  . Number of children: Not on file  . Years of education: Not on file  . Highest education level: Not on file  Occupational History  . Not on file  Social Needs  . Financial resource strain: Not on file  . Food insecurity:    Worry: Not on file    Inability: Not on file  . Transportation needs:    Medical: Not on file    Non-medical: Not on file  Tobacco Use  . Smoking status: Former Games developermoker  . Smokeless tobacco: Never Used  Substance and Sexual Activity  . Alcohol use: Yes    Comment: occ  . Drug use: No  . Sexual activity: Not on file  Lifestyle  . Physical activity:    Days per week: Not on file    Minutes per session: Not on file  . Stress: Not on file  Relationships  . Social connections:    Talks on phone: Not on file    Gets together: Not on file    Attends religious service: Not on file    Active member of club or organization: Not on file    Attends meetings of clubs or organizations: Not on file    Relationship status: Not on file  . Intimate partner violence:    Fear of current or ex partner: Not on file    Emotionally abused: Not on file    Physically abused: Not on file    Forced sexual activity: Not on file  Other Topics Concern  . Not on file  Social History Narrative  . Not on file   Health Maintenance  Topic Date Due  . Hepatitis C Screening  11-Oct-1945  . TETANUS/TDAP  10/31/1966  . MAMMOGRAM  10/30/1997  . COLONOSCOPY  10/30/1997  . DEXA SCAN  10/30/2012  . PNA vac Low Risk Adult (1 of 2 - PCV13) 10/30/2012  . INFLUENZA VACCINE  12/28/2017    The following portions of the patient's history were reviewed and updated as appropriate: allergies, current medications, past family history, past medical  history, past social history, past surgical history and problem list.  Review of Systems Pertinent items noted in HPI and remainder of comprehensive ROS otherwise negative.   Objective:    BP 136/80   Pulse 67   Ht 5\' 3"  (1.6 m)   Wt 154 lb 12.8 oz (70.2 kg)   BMI 27.42 kg/m  General appearance: alert, cooperative and appears stated age Head: Normocephalic, without obvious abnormality, atraumatic Neck: no adenopathy, supple, symmetrical, trachea midline and thyroid not enlarged, symmetric, no tenderness/mass/nodules Lungs: clear to auscultation bilaterally Breasts: normal appearance, no masses or tenderness Heart: regular rate and rhythm Abdomen: soft, non-tender; bowel sounds normal; no masses,  no organomegaly Pelvic: loss of architecture of labia minora, pallor noted Extremities: Homans sign is negative, no sign of DVT Skin: Skin color, texture, turgor normal. No rashes or lesions Neurologic: Grossly normal    Assessment:    GYN female exam.      Plan:   Problem List Items Addressed This Visit      Unprioritized   Lichen sclerosus et atrophicus    Treat with temovate--verbal and written instructions given  Relevant Medications   Clobetasol Prop Emollient Base (CLOBETASOL PROPIONATE E) 0.05 % emollient cream    Other Visit Diagnoses    Encounter for gynecological examination with abnormal finding    -  Primary     Return in 6 months (on 05/10/2018).    See After Visit Summary for Counseling Recommendations

## 2017-11-09 ENCOUNTER — Encounter: Payer: Self-pay | Admitting: Family Medicine

## 2017-11-09 DIAGNOSIS — L900 Lichen sclerosus et atrophicus: Secondary | ICD-10-CM | POA: Insufficient documentation

## 2017-11-09 NOTE — Assessment & Plan Note (Signed)
Treat with temovate--verbal and written instructions given

## 2020-11-03 ENCOUNTER — Encounter (HOSPITAL_BASED_OUTPATIENT_CLINIC_OR_DEPARTMENT_OTHER): Payer: Self-pay

## 2020-11-03 ENCOUNTER — Emergency Department (HOSPITAL_BASED_OUTPATIENT_CLINIC_OR_DEPARTMENT_OTHER)
Admission: EM | Admit: 2020-11-03 | Discharge: 2020-11-03 | Disposition: A | Discharge status: Home / Self Care | Payer: Medicare (Managed Care) | Attending: Emergency Medicine | Admitting: Emergency Medicine

## 2020-11-03 ENCOUNTER — Emergency Department (HOSPITAL_BASED_OUTPATIENT_CLINIC_OR_DEPARTMENT_OTHER): Payer: Medicare (Managed Care)

## 2020-11-03 ENCOUNTER — Other Ambulatory Visit: Payer: Self-pay

## 2020-11-03 DIAGNOSIS — Z87891 Personal history of nicotine dependence: Secondary | ICD-10-CM | POA: Insufficient documentation

## 2020-11-03 DIAGNOSIS — I10 Essential (primary) hypertension: Secondary | ICD-10-CM | POA: Insufficient documentation

## 2020-11-03 DIAGNOSIS — R079 Chest pain, unspecified: Secondary | ICD-10-CM | POA: Insufficient documentation

## 2020-11-03 DIAGNOSIS — W57XXXA Bitten or stung by nonvenomous insect and other nonvenomous arthropods, initial encounter: Secondary | ICD-10-CM | POA: Insufficient documentation

## 2020-11-03 DIAGNOSIS — S70361A Insect bite (nonvenomous), right thigh, initial encounter: Secondary | ICD-10-CM | POA: Diagnosis not present

## 2020-11-03 DIAGNOSIS — Z79899 Other long term (current) drug therapy: Secondary | ICD-10-CM | POA: Diagnosis not present

## 2020-11-03 HISTORY — DX: Unspecified atrial fibrillation: I48.91

## 2020-11-03 LAB — CBC
HCT: 40.3 % (ref 36.0–46.0)
Hemoglobin: 13.7 g/dL (ref 12.0–15.0)
MCH: 30.2 pg (ref 26.0–34.0)
MCHC: 34 g/dL (ref 30.0–36.0)
MCV: 88.8 fL (ref 80.0–100.0)
Platelets: 429 10*3/uL — ABNORMAL HIGH (ref 150–400)
RBC: 4.54 MIL/uL (ref 3.87–5.11)
RDW: 12.7 % (ref 11.5–15.5)
WBC: 7.9 10*3/uL (ref 4.0–10.5)
nRBC: 0 % (ref 0.0–0.2)

## 2020-11-03 LAB — BASIC METABOLIC PANEL
Anion gap: 9 (ref 5–15)
BUN: 12 mg/dL (ref 8–23)
CO2: 22 mmol/L (ref 22–32)
Calcium: 9.2 mg/dL (ref 8.9–10.3)
Chloride: 105 mmol/L (ref 98–111)
Creatinine, Ser: 0.95 mg/dL (ref 0.44–1.00)
GFR, Estimated: >60 mL/min (ref >60)
Glucose, Bld: 99 mg/dL (ref 70–99)
Potassium: 3.6 mmol/L (ref 3.5–5.1)
Sodium: 136 mmol/L (ref 135–145)

## 2020-11-03 LAB — TROPONIN I (HIGH SENSITIVITY)
Troponin I (High Sensitivity): 2 ng/L (ref <18)
Troponin I (High Sensitivity): 3 ng/L (ref <18)

## 2020-11-03 NOTE — Discharge Instructions (Addendum)
Your work-up today was reassuring.  Please drink plenty of water, take the doxycycline as prescribed by her PCP and follow-up with your doctor for reevaluation.  As we discussed, your chest x-ray looks quite clear however there is a question of atelectasis--which is a normal finding that is not associate with infection--versus an infiltrate which is associated with pneumonia however given your physical exam, symptoms I have very low suspicion for pneumonia however doxycycline will cover this.   Please give the area where I removed the head of the tick clean.  You may use warm soapy water to keep this area clean.

## 2020-11-03 NOTE — ED Triage Notes (Addendum)
Pt reports partial tick removal today and needs "one leg" of the tick removed-also c/o palpitations that started while she was in [place] with hx of Afib-when questioned if having pain she reports dull central CP-NAD-steady gait

## 2020-11-03 NOTE — ED Provider Notes (Signed)
[facility] EMERGENCY DEPARTMENT Provider Note   CSN: [id number] Arrival date & time: 11/03/20  1218     History Chief Complaint  Patient presents with  . Tick Removal  . Chest Pain    Misty Santos is a 75 y.o. female.  HPI Patient is a 75 year old female with past medical history significant for PAF with very infrequent episodes, depression, HTN, hiatal hernia, kidney stones, reflux  Patient is presenting today with concerns over an embedded tick head which is in her right thigh she states that she was bit this morning when she was gardening she is uncertain of the exact time and states it has been in her leg for probably 2 to 3 hours before she arrived in the ER.  She states that she attempted to remove the tape but when she pulled together her leg noticed that there was still part of it stuck in her leg.  She assumed this was one of the legs of the tach and came to the ER for removal.  She denies any fevers or chills no history of Lyme disease no immune compromising diseases.   Patient states that when she was in the ER she became aware of a sensation in her chest that she describes as pressure or palpitations.  She states she has a history of paroxysmal A. Fib which she states she can normally feel palpitations.  She states that she felt some pressure while she was being checked in to the ER.  She states this pressure has since resolved.  She is not having shortness of breath nausea or diaphoresis.  She states the pain/pressure and palpitations were not worse with ambulation however they pleuritic in nature.  She denies any other associated symptoms.  No recent surgeries, hospitalization, long travel, hemoptysis, estrogen containing OCP, cancer history.  No unilateral leg swelling.  No history of PE or VTE.      Past Medical History:  Diagnosis Date  . A-fib (HCC)   . Barrett esophagus   . Depression   . Hiatal hernia   . Hypertension   . Kidney stone      Patient Active Problem List   Diagnosis Date Noted  . Lichen sclerosus et atrophicus 11/09/2017    Past Surgical History:  Procedure Laterality Date  . CHOLECYSTECTOMY       OB History    Gravida  3   Para      Term      Preterm      AB      Living        SAB      IAB      Ectopic      Multiple      Live Births  3           Family History  Adopted: Yes    Social History   Tobacco Use  . Smoking status: Former Games developer  . Smokeless tobacco: Never Used  Substance Use Topics  . Alcohol use: Yes    Comment: occ  . Drug use: No    Home Medications Prior to Admission medications   Medication Sig Start Date End Date Taking? Authorizing Provider  amLODipine (NORVASC) 10 MG tablet Take 10 mg by mouth daily.    [provider]  atorvastatin (LIPITOR) 20 MG tablet Take 20 mg by mouth daily.    [provider]  BELSOMRA 15 MG TABS TK 1 T PO ATN PRF SLP 08/28/17   [provider]  Clobetasol Prop Emollient Base (CLOBETASOL PROPIONATE E) 0.05 % emollient cream Apply 1 application topically 2 (two) times daily. Bid x 6 wks, then q day x 6 wks, then qod x 6 wks, then 2x/wk indefinitely 11/08/17   Reva Bores, MD  donepezil (ARICEPT) 10 MG tablet 10 mg.  07/28/17   [provider]  escitalopram (LEXAPRO) 10 MG tablet Take 10 mg by mouth daily.    [provider]  fluconazole (DIFLUCAN) 100 MG tablet Take 1 tablet (100 mg total) by mouth daily. Patient not taking: Reported on 11/08/2017 09/26/17   Aviva Signs, CNM  ibuprofen (ADVIL,MOTRIN) 200 MG tablet Take 400 mg by mouth every 6 (six) hours as needed. Patient used this medication for headache.    [provider]  ondansetron (ZOFRAN ODT) 4 MG disintegrating tablet Take 1 tablet (4 mg total) by mouth every 8 (eight) hours as needed for nausea. Patient not taking: Reported on 09/26/2017 02/17/15   Garlon Hatchet, PA-C  pantoprazole (PROTONIX) 40 MG tablet  Take 40 mg by mouth daily. Patient is using this medication for Barrett's disease.    [provider]  potassium chloride SA (K-DUR,KLOR-CON) 20 MEQ tablet Take 20 mEq by mouth 2 (two) times daily.    [provider]  zolpidem (AMBIEN) 5 MG tablet Take by mouth. 08/17/16   [provider]    Allergies    Amoxicillin-pot clavulanate  Review of Systems   Review of Systems  Constitutional: Negative for chills and fever.  HENT: Negative for congestion.   Eyes: Negative for pain.  Respiratory: Negative for cough and shortness of breath.   Cardiovascular: Positive for chest pain. Negative for leg swelling.  Gastrointestinal: Negative for abdominal pain, diarrhea, nausea and vomiting.  Genitourinary: Negative for dysuria.  Musculoskeletal: Negative for myalgias.  Skin: Positive for wound (tick bite). Negative for rash.  Neurological: Negative for dizziness and headaches.    Physical Exam Updated Vital Signs BP (!) 159/77   Pulse (!) 58   Temp 98.6 F (37 C) (Oral)   Resp 14   Ht 5\' 4"  (1.626 m)   Wt 69.4 kg   SpO2 98%   BMI 26.26 kg/m   Physical Exam Vitals and nursing note reviewed.  Constitutional:      General: She is not in acute distress.    Comments: Pleasant well-appearing 75 year old.  In no acute distress.  Sitting comfortably in bed.  Able answer questions appropriately follow commands. No increased work of breathing. Speaking in full sentences.  HENT:     Head: Normocephalic and atraumatic.     Nose: Nose normal.     Mouth/Throat:     Mouth: Mucous membranes are moist.  Eyes:     General: No scleral icterus. Cardiovascular:     Rate and Rhythm: Normal rate and regular rhythm.     Pulses: Normal pulses.     Heart sounds: Normal heart sounds.  Pulmonary:     Effort: Pulmonary effort is normal. No respiratory distress.     Breath sounds: No wheezing.  Abdominal:     Palpations: Abdomen is soft.     Tenderness: There is no abdominal  tenderness. There is no guarding or rebound.  Musculoskeletal:     Cervical back: Normal range of motion.     Right lower leg: No edema.     Left lower leg: No edema.  Skin:    General: Skin is warm and dry.  Capillary Refill: Capillary refill takes less than 2 seconds.     Comments: Small brown imbedded foreign body in anterior right thigh consistent with tick head. No surrounding erythema.   Neurological:     Mental Status: She is alert. Mental status is at baseline.  Psychiatric:        Mood and Affect: Mood normal.        Behavior: Behavior normal.     ED Results / Procedures / Treatments   Labs (all labs ordered are listed, but only abnormal results are displayed) Labs Reviewed  CBC - Abnormal; Notable for the following components:      Result Value   Platelets 429 (*)    All other components within normal limits  BASIC METABOLIC PANEL  TROPONIN I (HIGH SENSITIVITY)  TROPONIN I (HIGH SENSITIVITY)    EKG None  Radiology DG Chest 2 View  Result Date: 11/03/2020 CLINICAL DATA:  Chest pain. EXAM: CHEST - 2 VIEW COMPARISON:  05/23/2013. FINDINGS: Mediastinum and hilar structures normal. Heart size normal. Mild left base atelectasis/infiltrate. No pleural effusion or pneumothorax. Degenerative change thoracic spine. EKG leads noted over the chest. Surgical clips right upper quadrant. IMPRESSION: Mild left base atelectasis/infiltrate. Electronically Signed   By: Maisie Fus  Register   On: 11/03/2020 13:18    Procedures .Foreign Body Removal  Date/Time: 11/03/2020 3:13 PM Performed by: Gailen Shelter, PA Authorized by: Gailen Shelter, PA  Consent: Verbal consent obtained. Consent given by: patient Patient understanding: patient states understanding of the procedure being performed Patient consent: the patient's understanding of the procedure matches consent given Procedure consent: procedure consent matches procedure scheduled Relevant documents: relevant documents  present and verified Test results: test results available and properly labeled Imaging studies: imaging studies available Patient identity confirmed: verbally with patient and arm band Body area: skin General location: lower extremity Location details: right upper leg Anesthesia method: none   Sedation: Patient sedated: no  Patient restrained: no Complexity: simple 1 objects recovered. Objects recovered: tick head Post-procedure assessment: foreign body removed Comments: Small tick head embedded in skin on right side.  This was removed with forceps with blunt end     Medications Ordered in ED Medications - No data to display  ED Course  I have reviewed the triage vital signs and the nursing notes.  Pertinent labs & imaging results that were available during my care of the patient were reviewed by me and considered in my medical decision making (see chart for details).    MDM Rules/Calculators/A&P                          Patient is a 75 year old female with past medical history detailed in HPI notably does have history of paroxysmal A. Fib. Is presented today to have a small part of tick removed from her right thigh noted to have chest pressure in the waiting room while she was being checked and as well as some heart palpitations.  She does have a history of A. fib although it does appear--per my review of her cardiology notes--that it is relatively rare.  She was placed on flecainide for a while and then her A. fib symptoms resolved.  She has not really had issues with this since then.  Her EKG is normal sinus rhythm with she is not in A. fib currently.  There are no ST-T wave abnormalities of note.  No significant axis deviation overall normal EKG.  This was reviewed  on my attending physician as well.  Troponin x2 within normal limits.  CBC without leukocytosis or anemia BMP unremarkable.  Chest x-ray with questionable infiltrate versus atelectasis given her lack of fever, lack  of tachycardia, normal exam on auscultation of her lungs and overall well-appearing presentation I have very low suspicion for pneumonia.  She is already been placed on doxycycline by her primary care provider.  She has been prescribed twice daily for 7 days.  I had shared decision-making conversation with patient and informed her that it may be reasonable  I discussed this case with my attending physician who cosigned this note including patient's presenting symptoms, physical exam, and planned diagnostics and interventions. Attending physician stated agreement with plan or made changes to plan which were implemented.   Patient is also troponin within normal limits.  Will discharge home at this time.  Patient agreeable to plan.  She is already started antibiotics doxycycline by PCP.   Final Clinical Impression(s) / ED Diagnoses Final diagnoses:  Chest pain, unspecified type  Tick bite of right thigh, initial encounter    Rx / DC Orders ED Discharge Orders    None       Gailen ShelterFondaw, Klinton Candelas S, [place]PA 11/04/20 Claris [id number]    Pricilla LovelessGoldston, Scott, MD 11/04/20 [phone]

## 2020-11-23 ENCOUNTER — Emergency Department (HOSPITAL_BASED_OUTPATIENT_CLINIC_OR_DEPARTMENT_OTHER): Payer: Medicare (Managed Care) | Admitting: Radiology

## 2020-11-23 ENCOUNTER — Emergency Department (HOSPITAL_BASED_OUTPATIENT_CLINIC_OR_DEPARTMENT_OTHER)
Admission: EM | Admit: 2020-11-23 | Discharge: 2020-11-23 | Disposition: A | Discharge status: Home / Self Care | Payer: Medicare (Managed Care) | Attending: Emergency Medicine | Admitting: Emergency Medicine

## 2020-11-23 ENCOUNTER — Encounter (HOSPITAL_BASED_OUTPATIENT_CLINIC_OR_DEPARTMENT_OTHER): Payer: Self-pay | Admitting: Emergency Medicine

## 2020-11-23 ENCOUNTER — Other Ambulatory Visit: Payer: Self-pay

## 2020-11-23 DIAGNOSIS — R52 Pain, unspecified: Secondary | ICD-10-CM

## 2020-11-23 DIAGNOSIS — Z87891 Personal history of nicotine dependence: Secondary | ICD-10-CM | POA: Diagnosis not present

## 2020-11-23 DIAGNOSIS — W01198A Fall on same level from slipping, tripping and stumbling with subsequent striking against other object, initial encounter: Secondary | ICD-10-CM | POA: Diagnosis not present

## 2020-11-23 DIAGNOSIS — M25511 Pain in right shoulder: Secondary | ICD-10-CM | POA: Diagnosis not present

## 2020-11-23 DIAGNOSIS — Y9301 Activity, walking, marching and hiking: Secondary | ICD-10-CM | POA: Insufficient documentation

## 2020-11-23 DIAGNOSIS — I10 Essential (primary) hypertension: Secondary | ICD-10-CM | POA: Diagnosis not present

## 2020-11-23 DIAGNOSIS — S6991XA Unspecified injury of right wrist, hand and finger(s), initial encounter: Secondary | ICD-10-CM | POA: Diagnosis present

## 2020-11-23 DIAGNOSIS — Z79899 Other long term (current) drug therapy: Secondary | ICD-10-CM | POA: Diagnosis not present

## 2020-11-23 DIAGNOSIS — S62346A Nondisplaced fracture of base of fifth metacarpal bone, right hand, initial encounter for closed fracture: Secondary | ICD-10-CM | POA: Diagnosis not present

## 2020-11-23 MED ORDER — HYDROCODONE-ACETAMINOPHEN 5-325 MG PO TABS: 1.0000 | ORAL_TABLET | ORAL | 0 refills | Status: AC | PRN

## 2020-11-23 NOTE — ED Provider Notes (Signed)
[facility] EMERGENCY DEPT Provider Note   CSN: [id number] Arrival date & time: 11/23/20  3419     History Chief Complaint  Patient presents with   Misty Santos    Misty Santos is a 75 y.o. female.  Pt presents to the ED today with right hand, wrist, and shoulder pain.  Pt was walking her dog and retracted the leash.  The dog did not agree with retracting the leash and kept going and pulled her over.  The pt did hit her head, but did not have a loc.  No n/v, no dizziness.  No blood thinners.  She was ambulatory after the fall.  She is right handed.      Past Medical History:  Diagnosis Date   A-fib (HCC)    Barrett esophagus    Depression    Hiatal hernia    Hypertension    Kidney stone     Patient Active Problem List   Diagnosis Date Noted   Lichen sclerosus et atrophicus 11/09/2017    Past Surgical History:  Procedure Laterality Date   CHOLECYSTECTOMY       OB History     Gravida  3   Para      Term      Preterm      AB      Living         SAB      IAB      Ectopic      Multiple      Live Births  3           Family History  Adopted: Yes    Social History   Tobacco Use   Smoking status: Former    Pack years: 0.00   Smokeless tobacco: Never  Substance Use Topics   Alcohol use: Yes    Comment: occ   Drug use: No    Home Medications Prior to Admission medications   Medication Sig Start Date End Date Taking? Authorizing Provider  amLODipine (NORVASC) 10 MG tablet Take 10 mg by mouth daily.   Yes [provider]  atorvastatin (LIPITOR) 20 MG tablet Take 20 mg by mouth daily.   Yes [provider]  donepezil (ARICEPT) 10 MG tablet 10 mg.  07/28/17  Yes [provider]  escitalopram (LEXAPRO) 10 MG tablet Take 10 mg by mouth daily.   Yes [provider]  HYDROcodone-acetaminophen (NORCO/VICODIN) 5-325 MG tablet Take 1 tablet by mouth every 4 (four) hours as needed. 11/23/20  Yes Jacalyn Lefevre, MD  pantoprazole (PROTONIX) 40 MG tablet Take 40 mg by mouth daily. Patient is using this medication for Barrett's disease.   Yes [provider]  potassium chloride SA (K-DUR,KLOR-CON) 20 MEQ tablet Take 20 mEq by mouth 2 (two) times daily.   Yes [provider]  zolpidem (AMBIEN) 5 MG tablet Take by mouth. 08/17/16  Yes [provider]  BELSOMRA 15 MG TABS TK 1 T PO ATN PRF SLP 08/28/17   [provider]  Clobetasol Prop Emollient Base (CLOBETASOL PROPIONATE E) 0.05 % emollient cream Apply 1 application topically 2 (two) times daily. Bid x 6 wks, then q day x 6 wks, then qod x 6 wks, then 2x/wk indefinitely 11/08/17   Reva Bores, MD  fluconazole (DIFLUCAN) 100 MG tablet Take 1 tablet (100 mg total) by mouth daily. Patient not taking: Reported on 11/08/2017 09/26/17   Aviva Signs, CNM  ibuprofen (ADVIL,MOTRIN) 200 MG tablet Take 400  mg by mouth every 6 (six) hours as needed. Patient used this medication for headache.    [provider]  ondansetron (ZOFRAN ODT) 4 MG disintegrating tablet Take 1 tablet (4 mg total) by mouth every 8 (eight) hours as needed for nausea. Patient not taking: Reported on 09/26/2017 02/17/15   Garlon Hatchet, PA-C    Allergies    Amoxicillin-pot clavulanate  Review of Systems   Review of Systems  Musculoskeletal:        Right hand, wrist, shoulder pain  All other systems reviewed and are negative.  Physical Exam Updated Vital Signs BP (!) 175/82 (BP Location: Right Arm)   Pulse (!) 55   Temp 97.8 F (36.6 C) (Oral)   Resp 15   Ht 5\' 4"  (1.626 m)   Wt 69.4 kg   SpO2 100%   BMI 26.26 kg/m   Physical Exam Vitals and nursing note reviewed.  Constitutional:      Appearance: Normal appearance.  HENT:     Head: Normocephalic and atraumatic.     Right Ear: External ear normal.     Left Ear: External ear normal.     Nose: Nose normal.     Mouth/Throat:     Mouth: Mucous membranes are moist.      Pharynx: Oropharynx is clear.  Eyes:     Extraocular Movements: Extraocular movements intact.     Conjunctiva/sclera: Conjunctivae normal.     Pupils: Pupils are equal, round, and reactive to light.  Cardiovascular:     Rate and Rhythm: Normal rate and regular rhythm.     Pulses: Normal pulses.     Heart sounds: Normal heart sounds.  Pulmonary:     Effort: Pulmonary effort is normal.     Breath sounds: Normal breath sounds.  Abdominal:     General: Abdomen is flat. Bowel sounds are normal.     Palpations: Abdomen is soft.  Musculoskeletal:     Right shoulder: Tenderness present.     Right wrist: Swelling present. Decreased range of motion.     Right hand: Swelling present. Decreased range of motion.     Cervical back: Normal range of motion and neck supple.  Skin:    General: Skin is warm.     Capillary Refill: Capillary refill takes less than 2 seconds.  Neurological:     General: No focal deficit present.     Mental Status: She is alert and oriented to person, place, and time.  Psychiatric:        Mood and Affect: Mood normal.        Behavior: Behavior normal.    ED Results / Procedures / Treatments   Labs (all labs ordered are listed, but only abnormal results are displayed) Labs Reviewed - No data to display  EKG None  Radiology DG Shoulder Right  Result Date: 11/23/2020 CLINICAL DATA:  Recent fall with right shoulder pain, initial encounter EXAM: RIGHT SHOULDER - 2+ VIEW COMPARISON:  None. FINDINGS: Degenerative changes of the acromioclavicular joint are noted. No acute fracture or dislocation is seen. Underlying bony thorax appears within normal limits. No soft tissue abnormality is noted. IMPRESSION: Degenerative changes without acute abnormality. Electronically Signed   By: 11/25/2020 M.D.   On: 11/23/2020 08:56   DG Wrist Complete Right  Result Date: 11/23/2020 CLINICAL DATA:  Recent fall with right wrist pain, initial encounter EXAM: RIGHT WRIST - COMPLETE 3+  VIEW COMPARISON:  None. FINDINGS: Mild degenerative changes are noted the first  CMC joint. The area of abnormality at the base of the fifth metacarpal is again identified suspicious for undisplaced fracture. No other fracture is seen. Carpal bones appear intact. IMPRESSION: Changes suspicious for fracture at the base of the fifth metacarpal. No other focal abnormality is noted. Electronically Signed   By: Alcide Clever M.D.   On: 11/23/2020 08:57   DG Hand Complete Right  Result Date: 11/23/2020 CLINICAL DATA:  Recent fall with right hand pain, initial encounter EXAM: RIGHT HAND - COMPLETE 3+ VIEW COMPARISON:  None. FINDINGS: Mild degenerative changes are noted in the interphalangeal joints as well as the first [facility] joint. Irregularity is noted at the base of the fifth metacarpal suspicious for undisplaced fracture. This is only seen on the frontal film IMPRESSION: Changes suspicious for fracture at the base of the fifth metacarpal. No other acute abnormality is seen. Electronically Signed   By: Alcide Clever M.D.   On: 11/23/2020 08:55    Procedures Procedures   Medications Ordered in ED Medications - No data to display  ED Course  I have reviewed the triage vital signs and the nursing notes.  Pertinent labs & imaging results that were available during my care of the patient were reviewed by me and considered in my medical decision making (see chart for details).    MDM Rules/Calculators/A&P                          Pt placed in a velcro wrist splint.  She did not want anything for pain here.  She is to f/u with Dr. Merlyn Lot (hand).  Return if worse.  Final Clinical Impression(s) / ED Diagnoses Final diagnoses:  Pain  Closed nondisplaced fracture of base of fifth metacarpal bone of right hand, initial encounter    Rx / DC Orders ED Discharge Orders          Ordered    HYDROcodone-acetaminophen (NORCO/VICODIN) 5-325 MG tablet  Every 4 hours PRN        11/23/20 0907              Jacalyn Lefevre, MD 11/23/20 [phone]

## 2020-11-23 NOTE — ED Notes (Signed)
Patient verbalizes understanding of discharge instructions. Opportunity for questioning and answers were provided. Patient discharged from ED.  °

## 2020-11-23 NOTE — ED Triage Notes (Signed)
Pt arrives to ED with c/o of fall. Pt reports her dog pulled her down and patient fell hitting her face, chest and catching herself with her right hand/wrist. Most pain is in the right hand/wrist due to her catching herself on a outstretched hand.

## 2020-11-23 NOTE — Discharge Instructions (Addendum)
Take tylenol and ibuprofen first.  If still in severe pain; it is ok to take lortab.

## 2021-12-16 IMAGING — CR DG CHEST 2V
2 series · 2 of 2 positions shown · non-contrast
Comparison: 05/23/2013.

CLINICAL DATA: Chest pain.

EXAM:
CHEST - 2 VIEW

[w chest pa]
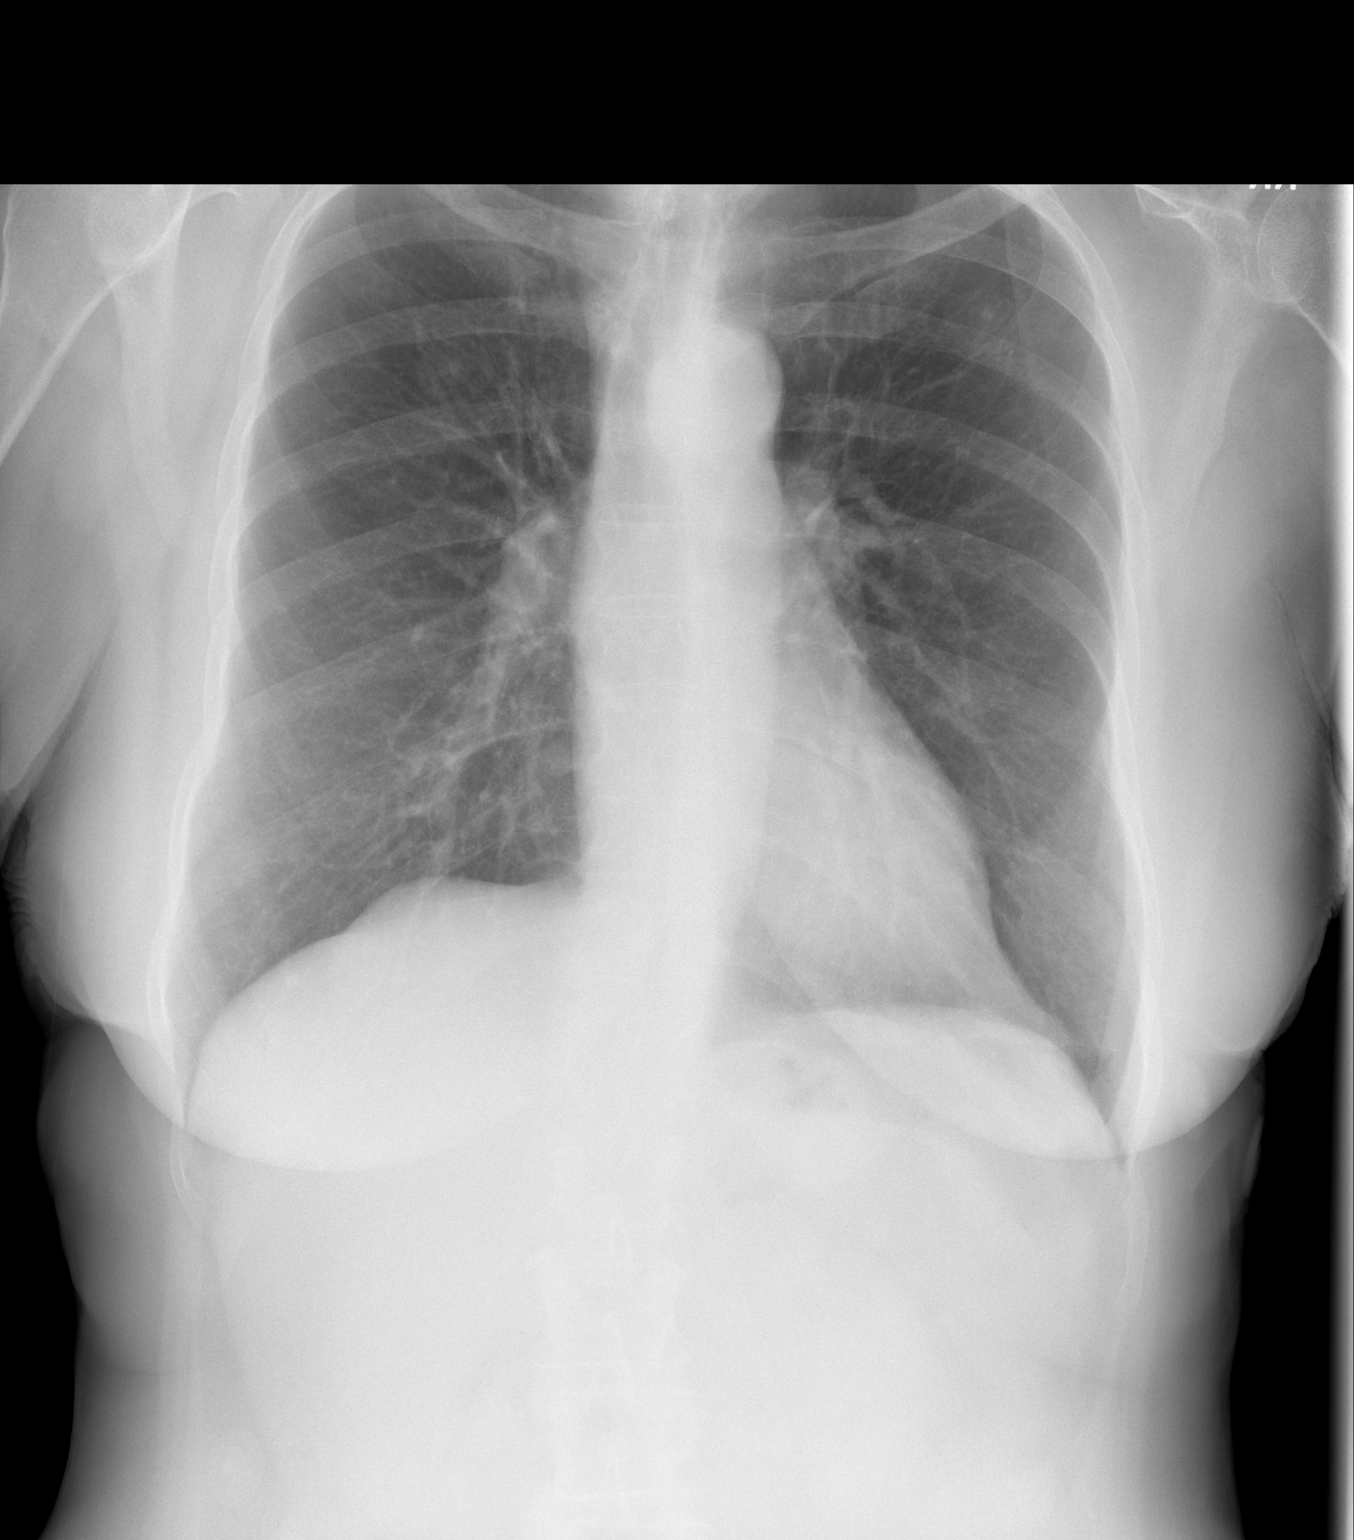

[w chest lat]
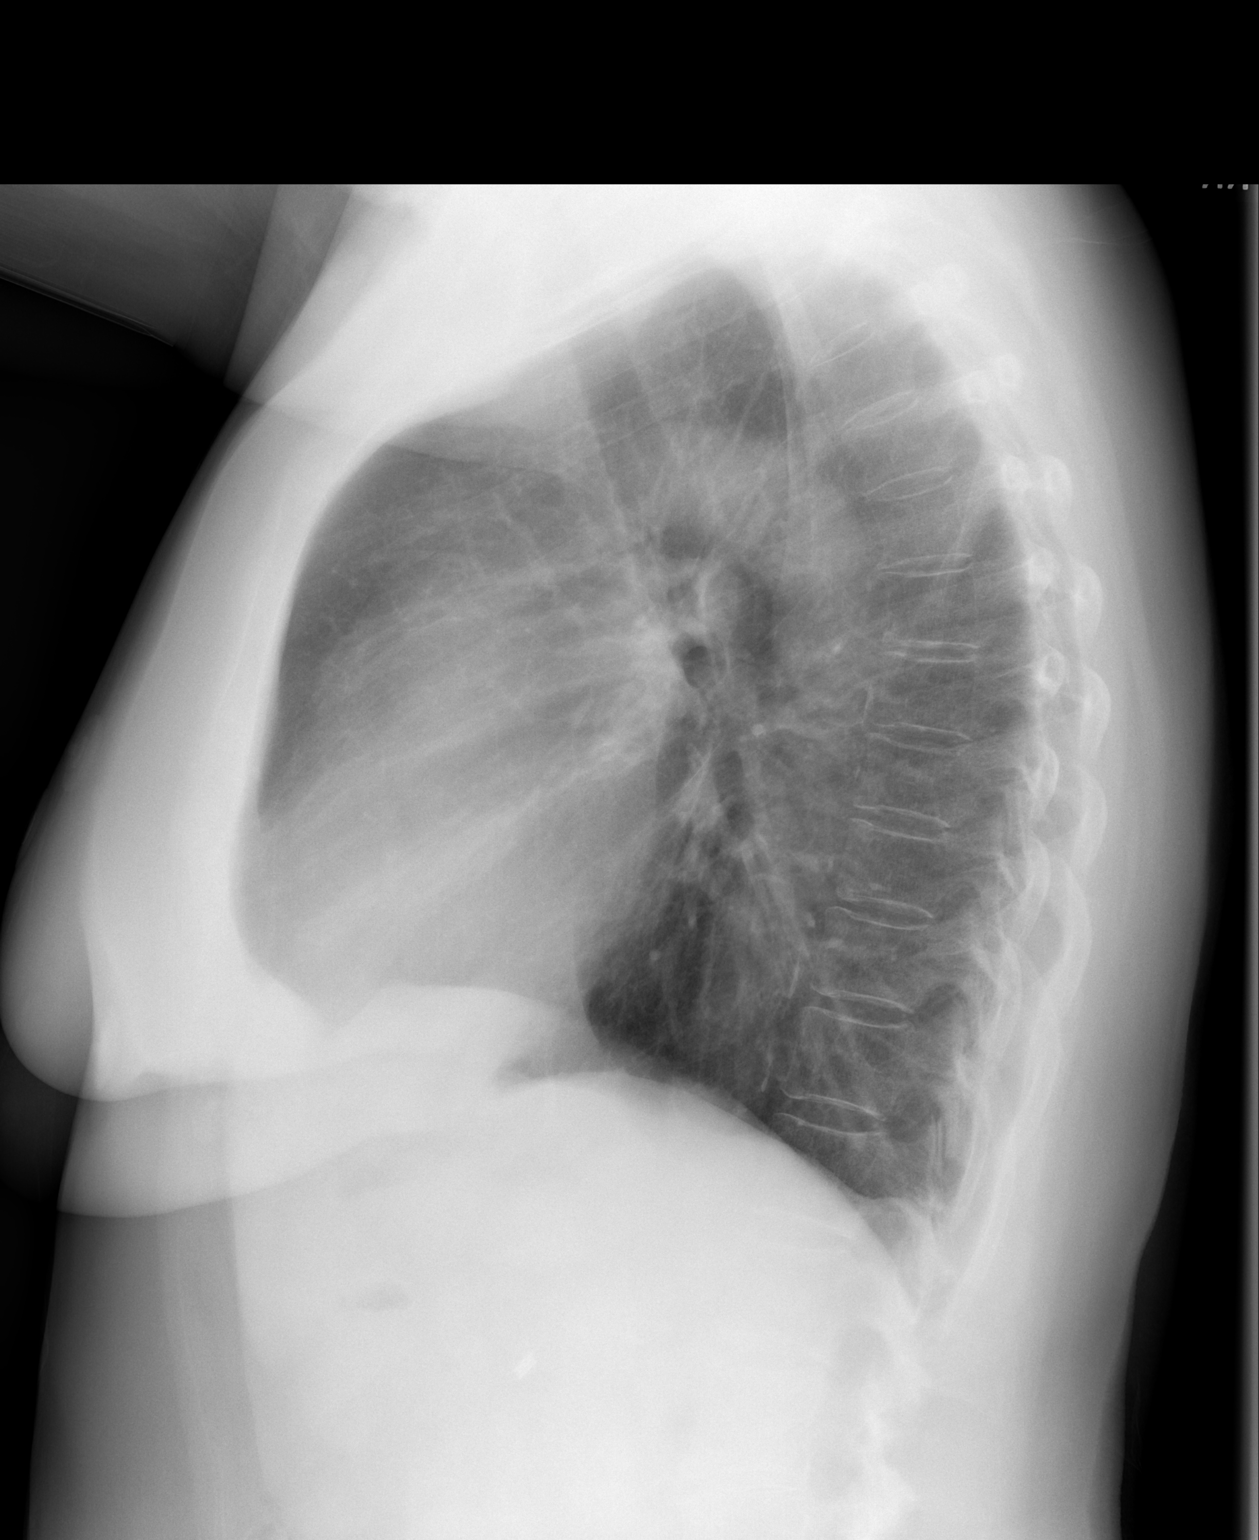

[2 of 2 positions shown; findings below may reference images not displayed]

FINDINGS: Mediastinum and hilar structures normal. Heart size normal. Mild
left base atelectasis/infiltrate. No pleural effusion or
pneumothorax. Degenerative change thoracic spine. EKG leads noted
over the chest. Surgical clips right upper quadrant.
IMPRESSION: Mild left base atelectasis/infiltrate.

## 2022-11-10 ENCOUNTER — Ambulatory Visit
Admission: EM | Admit: 2022-11-10 | Discharge: 2022-11-10 | Disposition: A | Discharge status: Home / Self Care | Payer: Medicare (Managed Care) | Attending: Urgent Care | Admitting: Urgent Care

## 2022-11-10 DIAGNOSIS — S51812A Laceration without foreign body of left forearm, initial encounter: Secondary | ICD-10-CM | POA: Diagnosis not present

## 2022-11-10 DIAGNOSIS — W548XXA Other contact with dog, initial encounter: Secondary | ICD-10-CM

## 2022-11-10 DIAGNOSIS — M79632 Pain in left forearm: Secondary | ICD-10-CM

## 2022-11-10 MED ORDER — MUPIROCIN 2 % EX OINT: 1.0000 | TOPICAL_OINTMENT | Freq: Three times a day (TID) | CUTANEOUS | 0 refills | Status: AC

## 2022-11-10 NOTE — ED Provider Notes (Signed)
[facility]  Note:  This document was prepared using Conservation officer, historic buildings and may include unintentional dictation errors.  MRN: [id number] DOB: 18-May-1946  Subjective:   Misty Santos is a 77 y.o. female presenting for wound check for scratch/laceration over the left forearm.  This happened from her dog jumping up toward her over the weekend.  Denies fever, drainage of pus or bleeding.  She has had some bruising which is common for her.  No current facility-administered medications for this encounter.  Current Outpatient Medications:    amLODipine (NORVASC) 10 MG tablet, Take 10 mg by mouth daily., Disp: , Rfl:    atorvastatin (LIPITOR) 20 MG tablet, Take 20 mg by mouth daily., Disp: , Rfl:    BELSOMRA 15 MG TABS, TK 1 T PO ATN PRF SLP, Disp: , Rfl: 2   Clobetasol Prop Emollient Base (CLOBETASOL PROPIONATE E) 0.05 % emollient cream, Apply 1 application topically 2 (two) times daily. Bid x 6 wks, then q day x 6 wks, then qod x 6 wks, then 2x/wk indefinitely, Disp: 30 g, Rfl: 5   donepezil (ARICEPT) 10 MG tablet, 10 mg. , Disp: , Rfl: 1   escitalopram (LEXAPRO) 10 MG tablet, Take 10 mg by mouth daily., Disp: , Rfl:    fluconazole (DIFLUCAN) 100 MG tablet, Take 1 tablet (100 mg total) by mouth daily. (Patient not taking: Reported on 11/08/2017), Disp: 3 tablet, Rfl: 1   HYDROcodone-acetaminophen (NORCO/VICODIN) 5-325 MG tablet, Take 1 tablet by mouth every 4 (four) hours as needed., Disp: 10 tablet, Rfl: 0   ibuprofen (ADVIL,MOTRIN) 200 MG tablet, Take 400 mg by mouth every 6 (six) hours as needed. Patient used this medication for headache., Disp: , Rfl:    ondansetron (ZOFRAN ODT) 4 MG disintegrating tablet, Take 1 tablet (4 mg total) by mouth every 8 (eight) hours as needed for nausea. (Patient not taking: Reported on 09/26/2017), Disp: 10 tablet, Rfl: 0   pantoprazole (PROTONIX) 40 MG tablet, Take 40 mg by mouth daily. Patient is using this medication for  Barrett's disease., Disp: , Rfl:    potassium chloride SA (K-DUR,KLOR-CON) 20 MEQ tablet, Take 20 mEq by mouth 2 (two) times daily., Disp: , Rfl:    zolpidem (AMBIEN) 5 MG tablet, Take by mouth., Disp: , Rfl:    Allergies  Allergen Reactions   Amoxicillin-Pot Clavulanate Diarrhea    Past Medical History:  Diagnosis Date   A-fib (HCC)    Barrett esophagus    Depression    Hiatal hernia    Hypertension    Kidney stone      Past Surgical History:  Procedure Laterality Date   CHOLECYSTECTOMY      Family History  Adopted: Yes    Social History   Tobacco Use   Smoking status: Former    Types: Cigarettes   Smokeless tobacco: Never  Substance Use Topics   Alcohol use: Yes    Comment: occ   Drug use: No    ROS   Objective:   Vitals: BP (!) 152/83 (BP Location: Right Arm)   Pulse (!) 59   Temp 98.1 F (36.7 C) (Oral)   Resp 20   SpO2 96%   Physical Exam Constitutional:      General: She is not in acute distress.    Appearance: Normal appearance. She is well-developed. She is not ill-appearing, toxic-appearing or diaphoretic.  HENT:     Head: Normocephalic and atraumatic.     Nose: Nose normal.  Mouth/Throat:     Mouth: Mucous membranes are moist.  Eyes:     General: No scleral icterus.       Right eye: No discharge.        Left eye: No discharge.     Extraocular Movements: Extraocular movements intact.  Cardiovascular:     Rate and Rhythm: Normal rate.  Pulmonary:     Effort: Pulmonary effort is normal.  Musculoskeletal:       Arms:  Skin:    General: Skin is warm and dry.  Neurological:     General: No focal deficit present.     Mental Status: She is alert and oriented to person, place, and time.  Psychiatric:        Mood and Affect: Mood normal.        Behavior: Behavior normal.      Dressing applied.  Assessment and Plan :   PDMP not reviewed this encounter.  1. Forearm laceration, left, initial encounter   2. Dog scratch   3.  Pain of left forearm     Wound is very well-appearing.  Will hold off on oral antibiotics.  Discussed wound care.  Dressing applied.  Counseled patient on potential for adverse effects with medications prescribed/recommended today, ER and return-to-clinic precautions discussed, patient verbalized understanding.    Wallis Bamberg, [place] 11/11/22 [phone]

## 2022-11-10 NOTE — ED Triage Notes (Signed)
Pt states her dog scratched her left forearm 4-6 days ago-NAD-steady gait
